# Patient Record
Sex: Female | Born: 1975 | Race: White | Hispanic: No | Marital: Married | Smoking: Former smoker
Health system: Southern US, Community
[De-identification: ages and names within clinical notes are randomized; demographics above are authoritative.]

## PROBLEM LIST (undated history)

## (undated) ENCOUNTER — Inpatient Hospital Stay (HOSPITAL_COMMUNITY): Payer: Self-pay

## (undated) DIAGNOSIS — D649 Anemia, unspecified: Secondary | ICD-10-CM

## (undated) DIAGNOSIS — E039 Hypothyroidism, unspecified: Secondary | ICD-10-CM

## (undated) DIAGNOSIS — O210 Mild hyperemesis gravidarum: Secondary | ICD-10-CM

## (undated) DIAGNOSIS — O09529 Supervision of elderly multigravida, unspecified trimester: Secondary | ICD-10-CM

## (undated) DIAGNOSIS — Z8619 Personal history of other infectious and parasitic diseases: Secondary | ICD-10-CM

## (undated) DIAGNOSIS — M1990 Unspecified osteoarthritis, unspecified site: Secondary | ICD-10-CM

## (undated) DIAGNOSIS — E0590 Thyrotoxicosis, unspecified without thyrotoxic crisis or storm: Secondary | ICD-10-CM

## (undated) DIAGNOSIS — A609 Anogenital herpesviral infection, unspecified: Secondary | ICD-10-CM

## (undated) DIAGNOSIS — R51 Headache: Secondary | ICD-10-CM

## (undated) HISTORY — DX: Personal history of other infectious and parasitic diseases: Z86.19

## (undated) HISTORY — DX: Anemia, unspecified: D64.9

## (undated) HISTORY — PX: BREAST ENHANCEMENT SURGERY: SHX7

## (undated) HISTORY — DX: Supervision of elderly multigravida, unspecified trimester: O09.529

## (undated) HISTORY — DX: Mild hyperemesis gravidarum: O21.0

## (undated) HISTORY — DX: Hypothyroidism, unspecified: E03.9

## (undated) HISTORY — DX: Anogenital herpesviral infection, unspecified: A60.9

## (undated) HISTORY — DX: Thyrotoxicosis, unspecified without thyrotoxic crisis or storm: E05.90

## (undated) HISTORY — DX: Unspecified osteoarthritis, unspecified site: M19.90

## (undated) HISTORY — DX: Headache: R51

---

## 2004-05-21 HISTORY — PX: AUGMENTATION MAMMAPLASTY: SUR837

## 2004-05-21 HISTORY — PX: BREAST SURGERY: SHX581

## 2007-04-03 ENCOUNTER — Encounter: Admission: RE | Admit: 2007-04-03 | Discharge: 2007-04-03 | Payer: Self-pay | Admitting: Obstetrics and Gynecology

## 2007-08-18 ENCOUNTER — Inpatient Hospital Stay (HOSPITAL_COMMUNITY): Admission: AD | Admit: 2007-08-18 | Discharge: 2007-08-20 | Payer: Self-pay | Admitting: *Deleted

## 2008-12-30 ENCOUNTER — Inpatient Hospital Stay (HOSPITAL_COMMUNITY): Admission: AD | Admit: 2008-12-30 | Discharge: 2008-12-31 | Payer: Self-pay | Admitting: Obstetrics and Gynecology

## 2010-08-26 LAB — CBC
HCT: 33.9 % — ABNORMAL LOW (ref 36.0–46.0)
Hemoglobin: 10.3 g/dL — ABNORMAL LOW (ref 12.0–15.0)
MCHC: 34.3 g/dL (ref 30.0–36.0)
MCHC: 34.9 g/dL (ref 30.0–36.0)
MCV: 93.3 fL (ref 78.0–100.0)
Platelets: 121 10*3/uL — ABNORMAL LOW (ref 150–400)
Platelets: 122 10*3/uL — ABNORMAL LOW (ref 150–400)
RDW: 13.2 % (ref 11.5–15.5)
RDW: 13.3 % (ref 11.5–15.5)
WBC: 10.2 10*3/uL (ref 4.0–10.5)

## 2010-08-26 LAB — RPR: RPR Ser Ql: NONREACTIVE

## 2011-02-12 LAB — CBC
HCT: 30.1 — ABNORMAL LOW
Hemoglobin: 11.7 — ABNORMAL LOW
MCHC: 35.5
MCV: 91.4
Platelets: 154
RBC: 3.29 — ABNORMAL LOW
RBC: 3.65 — ABNORMAL LOW
WBC: 12.2 — ABNORMAL HIGH
WBC: 12.3 — ABNORMAL HIGH

## 2011-02-12 LAB — RPR: RPR Ser Ql: NONREACTIVE

## 2011-05-22 NOTE — L&D Delivery Note (Signed)
Delivery Note At 10:52 PM a viable and healthy female was delivered via Vaginal, Spontaneous Delivery (Presentation: Left Occiput Posterior).  APGAR: 9, 9; weight .  Pending Placenta status: Intact, Spontaneous.  Not sent Cord: 3 vessels with the following complications: None.  Cord pH: none  Anesthesia: None  Episiotomy: None Lacerations: 2nd degree;Perineal Suture Repair: 3.0 chromic Est. Blood Loss (mL): 200  Mom to postpartum.  Baby to nursery-stable.  Angelica Martin A 04/30/2012, 11:24 PM

## 2011-10-02 LAB — OB RESULTS CONSOLE HIV ANTIBODY (ROUTINE TESTING): HIV: NONREACTIVE

## 2011-10-02 LAB — OB RESULTS CONSOLE RPR: RPR: NONREACTIVE

## 2011-10-02 LAB — OB RESULTS CONSOLE HEPATITIS B SURFACE ANTIGEN: Hepatitis B Surface Ag: NEGATIVE

## 2011-10-17 LAB — OB RESULTS CONSOLE GC/CHLAMYDIA: Chlamydia: NEGATIVE

## 2011-11-02 ENCOUNTER — Inpatient Hospital Stay (HOSPITAL_COMMUNITY)
Admission: AD | Admit: 2011-11-02 | Discharge: 2011-11-02 | Disposition: A | Discharge status: Home / Self Care | Payer: Managed Care, Other (non HMO) | Source: Ambulatory Visit | Attending: Obstetrics and Gynecology | Admitting: Obstetrics and Gynecology

## 2011-11-02 ENCOUNTER — Encounter (HOSPITAL_COMMUNITY): Payer: Self-pay | Admitting: *Deleted

## 2011-11-02 DIAGNOSIS — O211 Hyperemesis gravidarum with metabolic disturbance: Secondary | ICD-10-CM | POA: Insufficient documentation

## 2011-11-02 DIAGNOSIS — E860 Dehydration: Secondary | ICD-10-CM | POA: Insufficient documentation

## 2011-11-02 DIAGNOSIS — Z3490 Encounter for supervision of normal pregnancy, unspecified, unspecified trimester: Secondary | ICD-10-CM

## 2011-11-02 MED ORDER — DEXTROSE 5 % IN LACTATED RINGERS IV BOLUS
1000.0000 mL | INTRAVENOUS | Status: AC
  Administered 2011-11-02: 1000 mL via INTRAVENOUS

## 2011-11-02 MED ORDER — SODIUM CHLORIDE 0.9 % IV SOLN
8.0000 mg | Freq: Once | INTRAVENOUS | Status: AC
  Administered 2011-11-02: 8 mg via INTRAVENOUS
  Filled 2011-11-02: qty 4

## 2011-11-02 MED ORDER — M.V.I. ADULT IV INJ
Freq: Once | INTRAVENOUS | Status: AC
  Administered 2011-11-02: 20:00:00 via INTRAVENOUS
  Filled 2011-11-02: qty 1000

## 2011-11-02 MED ORDER — SODIUM CHLORIDE 0.9 % IJ SOLN
INTRAMUSCULAR | Status: AC
  Filled 2011-11-02: qty 3

## 2011-11-02 NOTE — MAU Note (Signed)
History   36 yo [id number] MWF now 13 1/[redacted] week gestation sent for IVF due to 4lb weight loss in a week and c/o vomiting and diarrhea x 5 days. No fever. PNC complicated by HEG for which pt is taking Zofran  Chief Complaint  Patient presents with  . Nausea  diarrhea x 5 days   OB History    Grav Para Term Preterm Abortions TAB SAB Ect Mult Living   4 2 2  0 1 0 1 0 0 2      History reviewed. No pertinent past medical history.  Past Surgical History  Procedure Date  . Breast enhancement surgery     History reviewed. No pertinent family history.  History  Substance Use Topics  . Smoking status: Former Games developer  . Smokeless tobacco: Not on file  . Alcohol Use: No    Allergies: No Known Allergies  Prescriptions prior to admission  Medication Sig Dispense Refill  . acetaminophen (TYLENOL) 500 MG tablet Take 500 mg by mouth daily as needed.      . ondansetron (ZOFRAN) 4 MG tablet Take 4 mg by mouth every 8 (eight) hours as needed. For nausea      . valACYclovir (VALTREX) 500 MG tablet Take 500 mg by mouth daily. Pt takes only when she has a "flare-up"         Physical Exam   Blood pressure 119/79, pulse 84, temperature 97 F (36.1 C), temperature source Oral, resp. rate 18.  No exam performed today, exam done in office prior to sending pt here. ED Course  Dehydration 2nd to GI flu IUP @ 13 1/7 wk P) IVF x 2 L, Zofran IV, MVI one amp  MDM   Samayah Novinger A, MD 6:54 PM 11/02/2011

## 2011-11-02 NOTE — MAU Note (Signed)
Dr Cherly Hensen sent pt over from office for IV fluids for hyperemesis. Pt states nausea throughout pregnancy.

## 2011-11-04 DIAGNOSIS — Z3490 Encounter for supervision of normal pregnancy, unspecified, unspecified trimester: Secondary | ICD-10-CM

## 2012-04-09 LAB — OB RESULTS CONSOLE GBS: GBS: NEGATIVE

## 2012-04-16 ENCOUNTER — Other Ambulatory Visit: Payer: Self-pay | Admitting: Obstetrics and Gynecology

## 2012-04-21 ENCOUNTER — Encounter (HOSPITAL_COMMUNITY): Payer: Self-pay | Admitting: *Deleted

## 2012-04-21 ENCOUNTER — Telehealth (HOSPITAL_COMMUNITY): Payer: Self-pay | Admitting: *Deleted

## 2012-04-21 NOTE — Telephone Encounter (Signed)
Preadmission screen  

## 2012-04-30 ENCOUNTER — Inpatient Hospital Stay (HOSPITAL_COMMUNITY)
Admission: AD | Admit: 2012-04-30 | Discharge: 2012-05-02 | DRG: 775 | Disposition: A | Discharge status: Home / Self Care | Payer: Managed Care, Other (non HMO) | Source: Ambulatory Visit | Attending: Obstetrics and Gynecology | Admitting: Obstetrics and Gynecology

## 2012-04-30 ENCOUNTER — Encounter (HOSPITAL_COMMUNITY): Payer: Self-pay | Admitting: *Deleted

## 2012-04-30 DIAGNOSIS — Z3490 Encounter for supervision of normal pregnancy, unspecified, unspecified trimester: Secondary | ICD-10-CM

## 2012-04-30 DIAGNOSIS — E039 Hypothyroidism, unspecified: Secondary | ICD-10-CM | POA: Diagnosis present

## 2012-04-30 DIAGNOSIS — E079 Disorder of thyroid, unspecified: Secondary | ICD-10-CM | POA: Diagnosis present

## 2012-04-30 DIAGNOSIS — O09529 Supervision of elderly multigravida, unspecified trimester: Secondary | ICD-10-CM | POA: Diagnosis present

## 2012-04-30 LAB — ABO/RH: ABO/RH(D): O POS

## 2012-04-30 LAB — CBC
Hemoglobin: 12.4 g/dL (ref 12.0–15.0)
MCH: 31.6 pg (ref 26.0–34.0)
MCHC: 36 g/dL (ref 30.0–36.0)
Platelets: 162 10*3/uL (ref 150–400)
RDW: 13 % (ref 11.5–15.5)

## 2012-04-30 MED ORDER — IBUPROFEN 600 MG PO TABS
600.0000 mg | ORAL_TABLET | Freq: Four times a day (QID) | ORAL | Status: DC | PRN
  Administered 2012-05-01: 600 mg via ORAL
  Filled 2012-04-30: qty 1

## 2012-04-30 MED ORDER — OXYTOCIN 40 UNITS IN LACTATED RINGERS INFUSION - SIMPLE MED
62.5000 mL/h | INTRAVENOUS | Status: DC
  Administered 2012-04-30: 999 mL/h via INTRAVENOUS
  Filled 2012-04-30: qty 1000

## 2012-04-30 MED ORDER — ONDANSETRON HCL 4 MG/2ML IJ SOLN: 4.0000 mg | Freq: Four times a day (QID) | INTRAMUSCULAR | Status: DC | PRN

## 2012-04-30 MED ORDER — OXYTOCIN 10 UNIT/ML IJ SOLN
10.0000 [IU] | Freq: Once | INTRAMUSCULAR | Status: AC
  Administered 2012-04-30: 10 [IU] via INTRAMUSCULAR

## 2012-04-30 MED ORDER — LACTATED RINGERS IV SOLN
INTRAVENOUS | Status: DC
  Administered 2012-04-30: 22:00:00 via INTRAVENOUS

## 2012-04-30 MED ORDER — CITRIC ACID-SODIUM CITRATE 334-500 MG/5ML PO SOLN: 30.0000 mL | ORAL | Status: DC | PRN

## 2012-04-30 MED ORDER — ACETAMINOPHEN 325 MG PO TABS: 650.0000 mg | ORAL_TABLET | ORAL | Status: DC | PRN

## 2012-04-30 MED ORDER — OXYTOCIN 10 UNIT/ML IJ SOLN
INTRAMUSCULAR | Status: AC
  Filled 2012-04-30: qty 1

## 2012-04-30 MED ORDER — FLEET ENEMA 7-19 GM/118ML RE ENEM: 1.0000 | ENEMA | RECTAL | Status: DC | PRN

## 2012-04-30 MED ORDER — OXYTOCIN BOLUS FROM INFUSION: 500.0000 mL | INTRAVENOUS | Status: DC

## 2012-04-30 MED ORDER — OXYCODONE-ACETAMINOPHEN 5-325 MG PO TABS
1.0000 | ORAL_TABLET | ORAL | Status: DC | PRN
  Administered 2012-04-30: 1 via ORAL
  Filled 2012-04-30: qty 1

## 2012-04-30 MED ORDER — LACTATED RINGERS IV SOLN: 500.0000 mL | INTRAVENOUS | Status: DC | PRN

## 2012-04-30 MED ORDER — LIDOCAINE HCL (PF) 1 % IJ SOLN
30.0000 mL | INTRAMUSCULAR | Status: DC | PRN
  Administered 2012-04-30: 30 mL via SUBCUTANEOUS
  Filled 2012-04-30: qty 30

## 2012-04-30 NOTE — H&P (Signed)
Angelica Martin is a 36 y.o. female presenting now @ 38 5/7 weeks in active labor. SROM @ 9:30 pm History OB History    Grav Para Term Preterm Abortions TAB SAB Ect Mult Living   4 2 2  0 1 0 1 0 0 2     Past Medical History  Diagnosis Date  . Hypothyroidism   . Mild hyperemesis gravidarum, antepartum   . HSV (herpes simplex virus) anogenital infection   . Hyperthyroidism   . H/O varicella   . Headache   . AMA (advanced maternal age) multigravida 35+    Past Surgical History  Procedure Date  . Breast enhancement surgery   . Breast surgery 2006    aug   Family History: family history includes Alcohol abuse in her paternal grandfather; COPD in her paternal grandfather; Diabetes in her mother; Heart disease in her maternal grandmother; Hypertension in her father, maternal grandmother, paternal grandfather, and paternal grandmother; Migraines in her mother and sister; Multiple sclerosis in her paternal aunt; Stroke in her father, paternal grandfather, and paternal grandmother; and Thyroid cancer in her father. Social History:  reports that she has quit smoking. She does not have any smokeless tobacco history on file. She reports that she does not drink alcohol or use illicit drugs.   Prenatal Transfer Tool  Maternal Diabetes: No Genetic Screening: Normal Maternal Ultrasounds/Referrals: Normal Fetal Ultrasounds or other Referrals:  None Maternal Substance Abuse:  No Significant Maternal Medications:  Meds include: Other: valtrex Significant Maternal Lab Results:  Lab values include: Group B Strep negative Other Comments:  None  ROS neg  Dilation: 10 Effacement (%): 100 Station: +1 Exam by::  (dr Angelica Martin) Blood pressure 124/69, pulse 75, temperature 98.3 F (36.8 C), temperature source Oral, resp. rate 18. Maternal Exam:  Uterine Assessment: Contraction strength is moderate.  Contraction frequency is regular.   Abdomen: Patient reports no abdominal tenderness. Fetal  presentation: vertex  Introitus: Normal vulva. Amniotic fluid character: clear.  Pelvis: adequate for delivery.   Cervix: Cervix evaluated by digital exam.     Physical Exam  Constitutional: She is oriented to person, place, and time. She appears well-developed and well-nourished.  HENT:  Head: Normocephalic.  Neck: Neck supple.  Cardiovascular: Normal rate and regular rhythm.   Respiratory: Breath sounds normal.  GI: Soft.  Musculoskeletal: She exhibits no edema.  Neurological: She is alert and oriented to person, place, and time.  Skin: Skin is warm and dry.  Psychiatric: She has a normal mood and affect.    Prenatal labs: ABO, Rh: O/Positive/-- (05/14 0000) Antibody: Negative (05/14 0000) Rubella: Immune (05/14 0000) RPR: Nonreactive (05/14 0000)  HBsAg: Negative (05/14 0000)  HIV: Non-reactive (05/14 0000)  GBS: Negative (11/20 0000)   Assessment/Plan: Active labor P) admit. routine labs. Start pushing   Angelica Martin A 04/30/2012, 11:19 PM

## 2012-04-30 NOTE — Progress Notes (Signed)
IV infiltraed, d/c'd intact, pulses noted, MD at bedside and aware - arm wrapped in warm blanket, elevated No BPs or bracelets to arm Shela Commons, RN

## 2012-05-01 LAB — CBC
HCT: 32.8 % — ABNORMAL LOW (ref 36.0–46.0)
Hemoglobin: 11.4 g/dL — ABNORMAL LOW (ref 12.0–15.0)
MCH: 31.1 pg (ref 26.0–34.0)
MCHC: 34.8 g/dL (ref 30.0–36.0)
RDW: 13 % (ref 11.5–15.5)

## 2012-05-01 LAB — RPR: RPR Ser Ql: NONREACTIVE

## 2012-05-01 MED ORDER — LANOLIN HYDROUS EX OINT: TOPICAL_OINTMENT | CUTANEOUS | Status: DC | PRN

## 2012-05-01 MED ORDER — ONDANSETRON HCL 4 MG/2ML IJ SOLN: 4.0000 mg | INTRAMUSCULAR | Status: DC | PRN

## 2012-05-01 MED ORDER — DIPHENHYDRAMINE HCL 25 MG PO CAPS: 25.0000 mg | ORAL_CAPSULE | Freq: Four times a day (QID) | ORAL | Status: DC | PRN

## 2012-05-01 MED ORDER — SIMETHICONE 80 MG PO CHEW: 80.0000 mg | CHEWABLE_TABLET | ORAL | Status: DC | PRN

## 2012-05-01 MED ORDER — FERROUS SULFATE 325 (65 FE) MG PO TABS
325.0000 mg | ORAL_TABLET | Freq: Two times a day (BID) | ORAL | Status: DC
  Administered 2012-05-01 – 2012-05-02 (×3): 325 mg via ORAL
  Filled 2012-05-01 (×3): qty 1

## 2012-05-01 MED ORDER — ONDANSETRON HCL 4 MG PO TABS: 4.0000 mg | ORAL_TABLET | ORAL | Status: DC | PRN

## 2012-05-01 MED ORDER — ZOLPIDEM TARTRATE 5 MG PO TABS: 5.0000 mg | ORAL_TABLET | Freq: Every evening | ORAL | Status: DC | PRN

## 2012-05-01 MED ORDER — WITCH HAZEL-GLYCERIN EX PADS: 1.0000 "application " | MEDICATED_PAD | CUTANEOUS | Status: DC | PRN

## 2012-05-01 MED ORDER — PRENATAL MULTIVITAMIN CH
1.0000 | ORAL_TABLET | Freq: Every day | ORAL | Status: DC
  Administered 2012-05-01 – 2012-05-02 (×2): 1 via ORAL
  Filled 2012-05-01 (×2): qty 1

## 2012-05-01 MED ORDER — SENNOSIDES-DOCUSATE SODIUM 8.6-50 MG PO TABS
2.0000 | ORAL_TABLET | Freq: Every day | ORAL | Status: DC
  Administered 2012-05-02: 2 via ORAL

## 2012-05-01 MED ORDER — IBUPROFEN 600 MG PO TABS
600.0000 mg | ORAL_TABLET | Freq: Four times a day (QID) | ORAL | Status: DC
  Administered 2012-05-01 – 2012-05-02 (×5): 600 mg via ORAL
  Filled 2012-05-01 (×5): qty 1

## 2012-05-01 MED ORDER — DIBUCAINE 1 % RE OINT: 1.0000 "application " | TOPICAL_OINTMENT | RECTAL | Status: DC | PRN

## 2012-05-01 MED ORDER — BENZOCAINE-MENTHOL 20-0.5 % EX AERO
1.0000 "application " | INHALATION_SPRAY | CUTANEOUS | Status: DC | PRN
  Administered 2012-05-01: 1 via TOPICAL
  Filled 2012-05-01: qty 56

## 2012-05-01 MED ORDER — OXYCODONE-ACETAMINOPHEN 5-325 MG PO TABS: 1.0000 | ORAL_TABLET | ORAL | Status: DC | PRN

## 2012-05-01 NOTE — Progress Notes (Signed)
Pt to restroom to void, passed a large clot, voided large amount then became lightheaded, and flush.  Steadied back to bed. Instructed call to void again

## 2012-05-01 NOTE — Progress Notes (Signed)
Post Partum Day 1 NSVD with spontaneous onset of labor, viable female infant over 2nd degree laceration. Subjective: no complaints, up ad lib without syncope, voiding, tolerating PO, + flatus, +BM  Pain well controlled with po meds, taking motrin and percocet  BF: on demand Mood stable, bonding well    Objective: Blood pressure 130/68, pulse 85, temperature 98.7 F (37.1 C), temperature source Oral, resp. rate 18, height 5\' 6"  (1.676 m), weight 150 lb (68.04 kg), unknown if currently breastfeeding.  Physical Exam:  General: alert, cooperative and no distress Breasts: N/T Lungs: CTAB Heart: RRR Lochia: appropriate. Mod, rubra. Uterine Fundus: firm Perineum: 2nd degree laceration - healing well. DVT Evaluation: No evidence of DVT seen on physical exam. Negative Homan's sign. No cords or calf tenderness. No significant calf/ankle edema.   Basename 04/30/12 2214  HGB 12.4  HCT 34.4*   Results for orders placed during the hospital encounter of 04/30/12 (from the past 24 hour(s))  CBC     Status: Abnormal   Collection Time   04/30/12 10:14 PM      Component Value Range   WBC 8.9  4.0 - 10.5 K/uL   RBC 3.92  3.87 - 5.11 MIL/uL   Hemoglobin 12.4  12.0 - 15.0 g/dL   HCT 16.1 (*) 09.6 - 04.5 %   MCV 87.8  78.0 - 100.0 fL   MCH 31.6  26.0 - 34.0 pg   MCHC 36.0  30.0 - 36.0 g/dL   RDW 40.9  81.1 - 91.4 %   Platelets 162  150 - 400 K/uL  RPR     Status: Normal   Collection Time   04/30/12 10:14 PM      Component Value Range   RPR NON REACTIVE  NON REACTIVE  ABO/RH     Status: Normal   Collection Time   04/30/12 10:14 PM      Component Value Range   ABO/RH(D) O POS       Assessment/Plan: Plan for discharge tomorrow      LOS: 1 day   Angelica Martin 05/01/2012, 5:54 AM

## 2012-05-02 ENCOUNTER — Inpatient Hospital Stay (HOSPITAL_COMMUNITY): Admission: RE | Admit: 2012-05-02 | Payer: Managed Care, Other (non HMO) | Source: Ambulatory Visit

## 2012-05-02 MED ORDER — IBUPROFEN 600 MG PO TABS: 600.0000 mg | ORAL_TABLET | Freq: Four times a day (QID) | ORAL | Status: AC

## 2012-05-02 NOTE — Progress Notes (Signed)
Patient ID: Angelica Martin, female   DOB: [birth year], 36 y.o.   MRN: [id number] PPD # 2  Subjective: Pt reports feeling well and eager for d/c home/ Pain controlled with ibuprofen Tolerating po/ Voiding without problems/ No n/v Bleeding is light/ Newborn info:  Information for the patient's newborn:  [name], Mcbain Girl Xochitl [[id number]]  female Feeding: breast    Objective:  VS: Blood pressure 93/57, pulse 60, temperature 97.6 F (36.4 C), temperature source Oral, resp. rate 18.    Basename 05/01/12 0533 04/30/12 2214  WBC 13.0* 8.9  HGB 11.4* 12.4  HCT 32.8* 34.4*  PLT 138* 162    Blood type: --/--/O POS (12/11 2214) Rubella: Immune (05/14 0000)    Physical Exam:  General: A & O x 3  alert, cooperative and no distress CV: Regular rate and rhythm Resp: clear Abdomen: soft, nontender, normal bowel sounds Uterine Fundus: firm, below umbilicus, nontender Perineum: not inspected and pt is dressed Lochia: minimal Ext: Homans sign is negative, no sign of DVT and no edema, redness or tenderness in the calves or thighs    A/P: PPD # 2/ [id number]/ S/P: SVD with 2nd deg lac Doing well and stable for discharge home RX: Ibuprofen 600mg  po Q 6 hrs prn pain #30 Refill x 1 WOB/GYN booklet given Routine pp visit in 6wks   Demetrius Revel, MSN, [facility] 05/02/2012, 10:20 AM

## 2012-05-02 NOTE — Discharge Summary (Signed)
Obstetric Discharge Summary Reason for Admission: onset of labor and FT gestation @ 34wks; G4 P2 0 1 2 Prenatal Procedures: ultrasound Intrapartum Procedures: spontaneous vaginal delivery Postpartum Procedures: none Complications-Operative and Postpartum: 2nd degree perineal laceration Hemoglobin  Date Value Range Status  05/01/2012 11.4* 12.0 - 15.0 g/dL Final     HCT  Date Value Range Status  05/01/2012 32.8* 36.0 - 46.0 % Final    Physical Exam:  General: alert, cooperative and no distress Lochia: appropriate Uterine Fundus: firm Incision: na DVT Evaluation: No evidence of DVT seen on physical exam. Negative Homan's sign.  Discharge Diagnoses: Term Pregnancy-delivered and G4 P3 0 1 3  Discharge Information: Date: 05/02/2012 Activity: pelvic rest Diet: routine Medications: PNV and Ibuprofen Condition: stable Instructions: refer to practice specific booklet Discharge to: home Follow-up Information    Follow up with Angelica Heeney A, MD. In 6 weeks.   Contact information:   [address] [place] [address] [phone]          Newborn Data: Live born female  Birth Weight: 6 lb 14 oz (3118 g) APGAR: 9, 9  Home with mother.  FISHER,JULIE K 05/02/2012, 10:33 AM

## 2014-03-22 ENCOUNTER — Encounter (HOSPITAL_COMMUNITY): Payer: Self-pay | Admitting: *Deleted

## 2016-07-30 ENCOUNTER — Emergency Department (HOSPITAL_COMMUNITY): Payer: 59

## 2016-07-30 ENCOUNTER — Encounter (HOSPITAL_COMMUNITY): Payer: Self-pay

## 2016-07-30 ENCOUNTER — Emergency Department (HOSPITAL_COMMUNITY)
Admission: EM | Admit: 2016-07-30 | Discharge: 2016-07-30 | Disposition: A | Discharge status: Home / Self Care | Payer: 59 | Attending: Emergency Medicine | Admitting: Emergency Medicine

## 2016-07-30 DIAGNOSIS — Z87891 Personal history of nicotine dependence: Secondary | ICD-10-CM | POA: Insufficient documentation

## 2016-07-30 DIAGNOSIS — M79602 Pain in left arm: Secondary | ICD-10-CM

## 2016-07-30 DIAGNOSIS — F410 Panic disorder [episodic paroxysmal anxiety] without agoraphobia: Secondary | ICD-10-CM

## 2016-07-30 DIAGNOSIS — E039 Hypothyroidism, unspecified: Secondary | ICD-10-CM | POA: Diagnosis not present

## 2016-07-30 DIAGNOSIS — Z79899 Other long term (current) drug therapy: Secondary | ICD-10-CM | POA: Insufficient documentation

## 2016-07-30 DIAGNOSIS — R079 Chest pain, unspecified: Secondary | ICD-10-CM | POA: Diagnosis present

## 2016-07-30 DIAGNOSIS — R0602 Shortness of breath: Secondary | ICD-10-CM | POA: Diagnosis not present

## 2016-07-30 LAB — CBC
HEMATOCRIT: 37.3 % (ref 36.0–46.0)
Hemoglobin: 12.5 g/dL (ref 12.0–15.0)
MCH: 30.2 pg (ref 26.0–34.0)
MCHC: 33.5 g/dL (ref 30.0–36.0)
MCV: 90.1 fL (ref 78.0–100.0)
PLATELETS: 229 10*3/uL (ref 150–400)
RBC: 4.14 MIL/uL (ref 3.87–5.11)
RDW: 13.1 % (ref 11.5–15.5)
WBC: 5.7 10*3/uL (ref 4.0–10.5)

## 2016-07-30 LAB — BASIC METABOLIC PANEL
Anion gap: 10 (ref 5–15)
BUN: 13 mg/dL (ref 6–20)
CHLORIDE: 104 mmol/L (ref 101–111)
CO2: 24 mmol/L (ref 22–32)
Calcium: 9 mg/dL (ref 8.9–10.3)
Creatinine, Ser: 0.63 mg/dL (ref 0.44–1.00)
Glucose, Bld: 101 mg/dL — ABNORMAL HIGH (ref 65–99)
POTASSIUM: 3.7 mmol/L (ref 3.5–5.1)
SODIUM: 138 mmol/L (ref 135–145)

## 2016-07-30 LAB — D-DIMER, QUANTITATIVE: D-Dimer, Quant: 0.27 ug/mL-FEU (ref 0.00–0.50)

## 2016-07-30 LAB — TROPONIN I: Troponin I: <0.03 ng/mL (ref <0.03)

## 2016-07-30 NOTE — ED Triage Notes (Signed)
Pt endorses waking up with left arm pain and began to have shortness of breath with some chest discomfort. Pt states "I don't know if it was anxiety but I don't normally have anxiety" VSS.

## 2016-07-30 NOTE — ED Notes (Signed)
Sent add on label to main lab. 

## 2016-07-30 NOTE — ED Provider Notes (Signed)
[facility] DEPT Provider Note   CSN: [id number] Arrival date & time: 07/30/16  3785  By signing my name below, I, Higinio Plan, attest that this documentation has been prepared under the direction and in the presence of Orpah Greek, MD . Electronically Signed: Higinio Plan, Scribe. 07/30/2016. 3:16 AM.  History   Chief Complaint Chief Complaint  Patient presents with  . Shortness of Breath  . Chest Pain  . Anxiety   The history is provided by the patient. No language interpreter was used.   HPI Comments: Angelica Martin is a 41 y.o. female with PMHx of hyperthyroidism, who presents to the Emergency Department complaining of gradual onset, improving, left arm pain that began ~2 days ago and worsened this morning. Pt reports she experienced left arm pain before falling asleep and awoke ~2 hours PTA with severe pain throughout her left arm. She notes her pain is exacerbated at night and is unchanged with certain positions or movements. She states assocaited shortness of breath that began shortly after the onset of her arm pain and the sensation of generalized weakness when she stood up to walk to the bathroom. Pt reports she has been "flying a ton lately" within the [place] but denies any recent surgeries or immobilization and use of hormone replacement therapy. She also denies any numbness in her extremities and cardiac history.   Past Medical History:  Diagnosis Date  . AMA (advanced maternal age) multigravida 72+   . H/O varicella   . Headache(784.0)   . HSV (herpes simplex virus) anogenital infection   . Hyperthyroidism   . Hypothyroidism   . Mild hyperemesis gravidarum, antepartum     Patient Active Problem List   Diagnosis Date Noted  . NSVD (normal spontaneous vaginal delivery) 05/01/2012  . Perineal laceration during delivery, delivered 05/01/2012  . Postpartum care following vaginal delivery- 2nd (12/11) 05/01/2012  . Pregnancy 11/04/2011    Past Surgical  History:  Procedure Laterality Date  . BREAST ENHANCEMENT SURGERY    . BREAST SURGERY  2006   aug    OB History    Gravida Para Term Preterm AB Living   4 3 3  0 1 3   SAB TAB Ectopic Multiple Live Births   1 0 0 0 3     Home Medications    Prior to Admission medications   Medication Sig Start Date End Date Taking? Authorizing Provider  acetaminophen (TYLENOL) 500 MG tablet Take 500 mg by mouth daily as needed. For pain    Historical Provider, MD  ibuprofen (ADVIL,MOTRIN) 600 MG tablet Take 1 tablet (600 mg total) by mouth every 6 (six) hours. 05/02/12   Gustavo Lah, NP  Prenatal Vit-Fe Fumarate-FA (PRENATAL MULTIVITAMIN) TABS Take 1 tablet by mouth daily.    Historical Provider, MD    Family History Family History  Problem Relation Age of Onset  . Diabetes Mother   . Migraines Mother   . Hypertension Father   . Thyroid cancer Father     removed  . Stroke Father   . Migraines Sister   . Multiple sclerosis Paternal Aunt   . Hypertension Maternal Grandmother   . Heart disease Maternal Grandmother   . Hypertension Paternal Grandmother   . Stroke Paternal Grandmother   . Hypertension Paternal Grandfather   . Stroke Paternal Grandfather   . COPD Paternal Grandfather   . Alcohol abuse Paternal Grandfather     Social History Social History  Substance Use Topics  . Smoking status:  Former Smoker  . Smokeless tobacco: Never Used  . Alcohol use Yes     Comment: a glass of wine every other night   Allergies   Banana and Kiwi extract  Review of Systems Review of Systems  Respiratory: Positive for shortness of breath.   Musculoskeletal: Positive for arthralgias.  Neurological: Positive for weakness. Negative for numbness.  All other systems reviewed and are negative.  Physical Exam Updated Vital Signs BP 120/97 (BP Location: Right Arm)   Pulse 76   Temp 98 F (36.7 C) (Oral)   Resp 18   Ht 5\' 9"  (1.753 m)   Wt 122 lb (55.3 kg)   LMP 07/13/2016 (Approximate)    SpO2 100%   Breastfeeding? No   BMI 18.02 kg/m   Physical Exam  Constitutional: She is oriented to person, place, and time. She appears well-developed and well-nourished. No distress.  HENT:  Head: Normocephalic and atraumatic.  Right Ear: Hearing normal.  Left Ear: Hearing normal.  Nose: Nose normal.  Mouth/Throat: Oropharynx is clear and moist and mucous membranes are normal.  Eyes: Conjunctivae and EOM are normal. Pupils are equal, round, and reactive to light.  Neck: Normal range of motion. Neck supple.  Cardiovascular: Regular rhythm, S1 normal and S2 normal.  Exam reveals no gallop and no friction rub.   No murmur heard. Pulmonary/Chest: Effort normal and breath sounds normal. No respiratory distress. She exhibits no tenderness.  Abdominal: Soft. Normal appearance and bowel sounds are normal. There is no hepatosplenomegaly. There is no tenderness. There is no rebound, no guarding, no tenderness at McBurney's point and negative Murphy's sign. No hernia.  Musculoskeletal: Normal range of motion.  Neurological: She is alert and oriented to person, place, and time. She has normal strength. No cranial nerve deficit or sensory deficit. Coordination normal. GCS eye subscore is 4. GCS verbal subscore is 5. GCS motor subscore is 6.  Skin: Skin is warm, dry and intact. No rash noted. No cyanosis.  Psychiatric: She has a normal mood and affect. Her speech is normal and behavior is normal. Thought content normal.  Nursing note and vitals reviewed.  ED Treatments / Results  DIAGNOSTIC STUDIES:  Oxygen Saturation is 100% on RA, normal by my interpretation.    COORDINATION OF CARE:  3:10 AM Discussed treatment plan with pt at bedside and pt agreed to plan.  Labs (all labs ordered are listed, but only abnormal results are displayed) Labs Reviewed  BASIC METABOLIC PANEL - Abnormal; Notable for the following:       Result Value   Glucose, Bld 101 (*)    All other components within normal  limits  CBC  TROPONIN I  D-DIMER, QUANTITATIVE (NOT AT [facility])    EKG  EKG Interpretation  Date/Time:  Monday July 30 2016 02:32:46 EDT Ventricular Rate:  63 PR Interval:  156 QRS Duration: 86 QT Interval:  436 QTC Calculation: 446 R Axis:   85 Text Interpretation:  Normal sinus rhythm Normal ECG Confirmed by POLLINA  MD, CHRISTOPHER [id number]) on 07/30/2016 2:41:59 AM       Radiology Dg Chest 2 View  Result Date: 07/30/2016 CLINICAL DATA:  Chest discomfort and shortness of breath since 1 a.m. Pain down the left arm. EXAM: CHEST  2 VIEW COMPARISON:  None. FINDINGS: The heart size and mediastinal contours are within normal limits. Both lungs are clear. The visualized skeletal structures are unremarkable. IMPRESSION: No active cardiopulmonary disease. Electronically Signed   By: Oren Beckmann.D.  On: 07/30/2016 02:51    Procedures Procedures (including critical care time)  Medications Ordered in ED Medications - No data to display  Initial Impression / Assessment and Plan / ED Course  I have reviewed the triage vital signs and the nursing notes.  Pertinent labs & imaging results that were available during my care of the patient were reviewed by me and considered in my medical decision making (see chart for details).     Patient presents to the emergency department for evaluation of left arm pain, chest pain, shortness of breath. Patient reports that she has been experiencing pains in the left arm, mostly when she is laying in bed at night for the last couple of days. She woke up tonight had increased pain in the arm. No numbness or weakness, no paresthesias. She then started to have some pain in the chest, became short of breath and felt like her heart was racing. He did feel anxious when this was happening, but has no history of panic attacks.  She has no cardiac risk factors. Cardiac evaluation is unremarkable. This includes EKG and troponin. Patient has been traveling  recently by air. She does not have any unilateral leg swelling, signs of DVT. She is not hypoxic or tachycardic here in the ER. D-dimer was normal. This is reassuring and felt to adequately rule out PE. Patient's arm pain is likely musculoskeletal in nature, recommend anti-inflammatory medication, follow-up with PCP.  I personally performed the services described in this documentation, which was scribed in my presence. The recorded information has been reviewed and is accurate.   Final Clinical Impressions(s) / ED Diagnoses   Final diagnoses:  Arm pain, diffuse, left  Panic attack    New Prescriptions New Prescriptions   No medications on file     Orpah Greek, MD 07/30/16 0401

## 2016-07-30 NOTE — ED Notes (Signed)
Patient transported to X-ray 

## 2016-08-12 DIAGNOSIS — J029 Acute pharyngitis, unspecified: Secondary | ICD-10-CM | POA: Diagnosis not present

## 2016-08-12 DIAGNOSIS — J020 Streptococcal pharyngitis: Secondary | ICD-10-CM | POA: Diagnosis not present

## 2017-03-21 DIAGNOSIS — Z0000 Encounter for general adult medical examination without abnormal findings: Secondary | ICD-10-CM | POA: Diagnosis not present

## 2017-03-25 DIAGNOSIS — Z0000 Encounter for general adult medical examination without abnormal findings: Secondary | ICD-10-CM | POA: Diagnosis not present

## 2017-05-22 DIAGNOSIS — L720 Epidermal cyst: Secondary | ICD-10-CM | POA: Diagnosis not present

## 2017-05-22 DIAGNOSIS — D225 Melanocytic nevi of trunk: Secondary | ICD-10-CM | POA: Diagnosis not present

## 2017-05-22 DIAGNOSIS — L814 Other melanin hyperpigmentation: Secondary | ICD-10-CM | POA: Diagnosis not present

## 2017-06-24 DIAGNOSIS — R87610 Atypical squamous cells of undetermined significance on cytologic smear of cervix (ASC-US): Secondary | ICD-10-CM | POA: Diagnosis not present

## 2017-06-24 DIAGNOSIS — Z01419 Encounter for gynecological examination (general) (routine) without abnormal findings: Secondary | ICD-10-CM | POA: Diagnosis not present

## 2018-01-03 DIAGNOSIS — R109 Unspecified abdominal pain: Secondary | ICD-10-CM | POA: Diagnosis not present

## 2018-01-03 DIAGNOSIS — M545 Low back pain: Secondary | ICD-10-CM | POA: Diagnosis not present

## 2018-06-25 DIAGNOSIS — D225 Melanocytic nevi of trunk: Secondary | ICD-10-CM | POA: Diagnosis not present

## 2018-06-25 DIAGNOSIS — L821 Other seborrheic keratosis: Secondary | ICD-10-CM | POA: Diagnosis not present

## 2018-06-25 DIAGNOSIS — D2271 Melanocytic nevi of right lower limb, including hip: Secondary | ICD-10-CM | POA: Diagnosis not present

## 2018-06-25 DIAGNOSIS — Z1231 Encounter for screening mammogram for malignant neoplasm of breast: Secondary | ICD-10-CM | POA: Diagnosis not present

## 2018-06-25 DIAGNOSIS — Z681 Body mass index (BMI) 19 or less, adult: Secondary | ICD-10-CM | POA: Diagnosis not present

## 2018-06-25 DIAGNOSIS — Z01419 Encounter for gynecological examination (general) (routine) without abnormal findings: Secondary | ICD-10-CM | POA: Diagnosis not present

## 2018-07-11 IMAGING — CR DG CHEST 2V
2 series · 2 of 2 positions shown · non-contrast
Comparison: None.

CLINICAL DATA: Chest discomfort and shortness of breath since 1
a.m.. Pain down the left arm.

EXAM:
CHEST  2 VIEW

[chest pa]
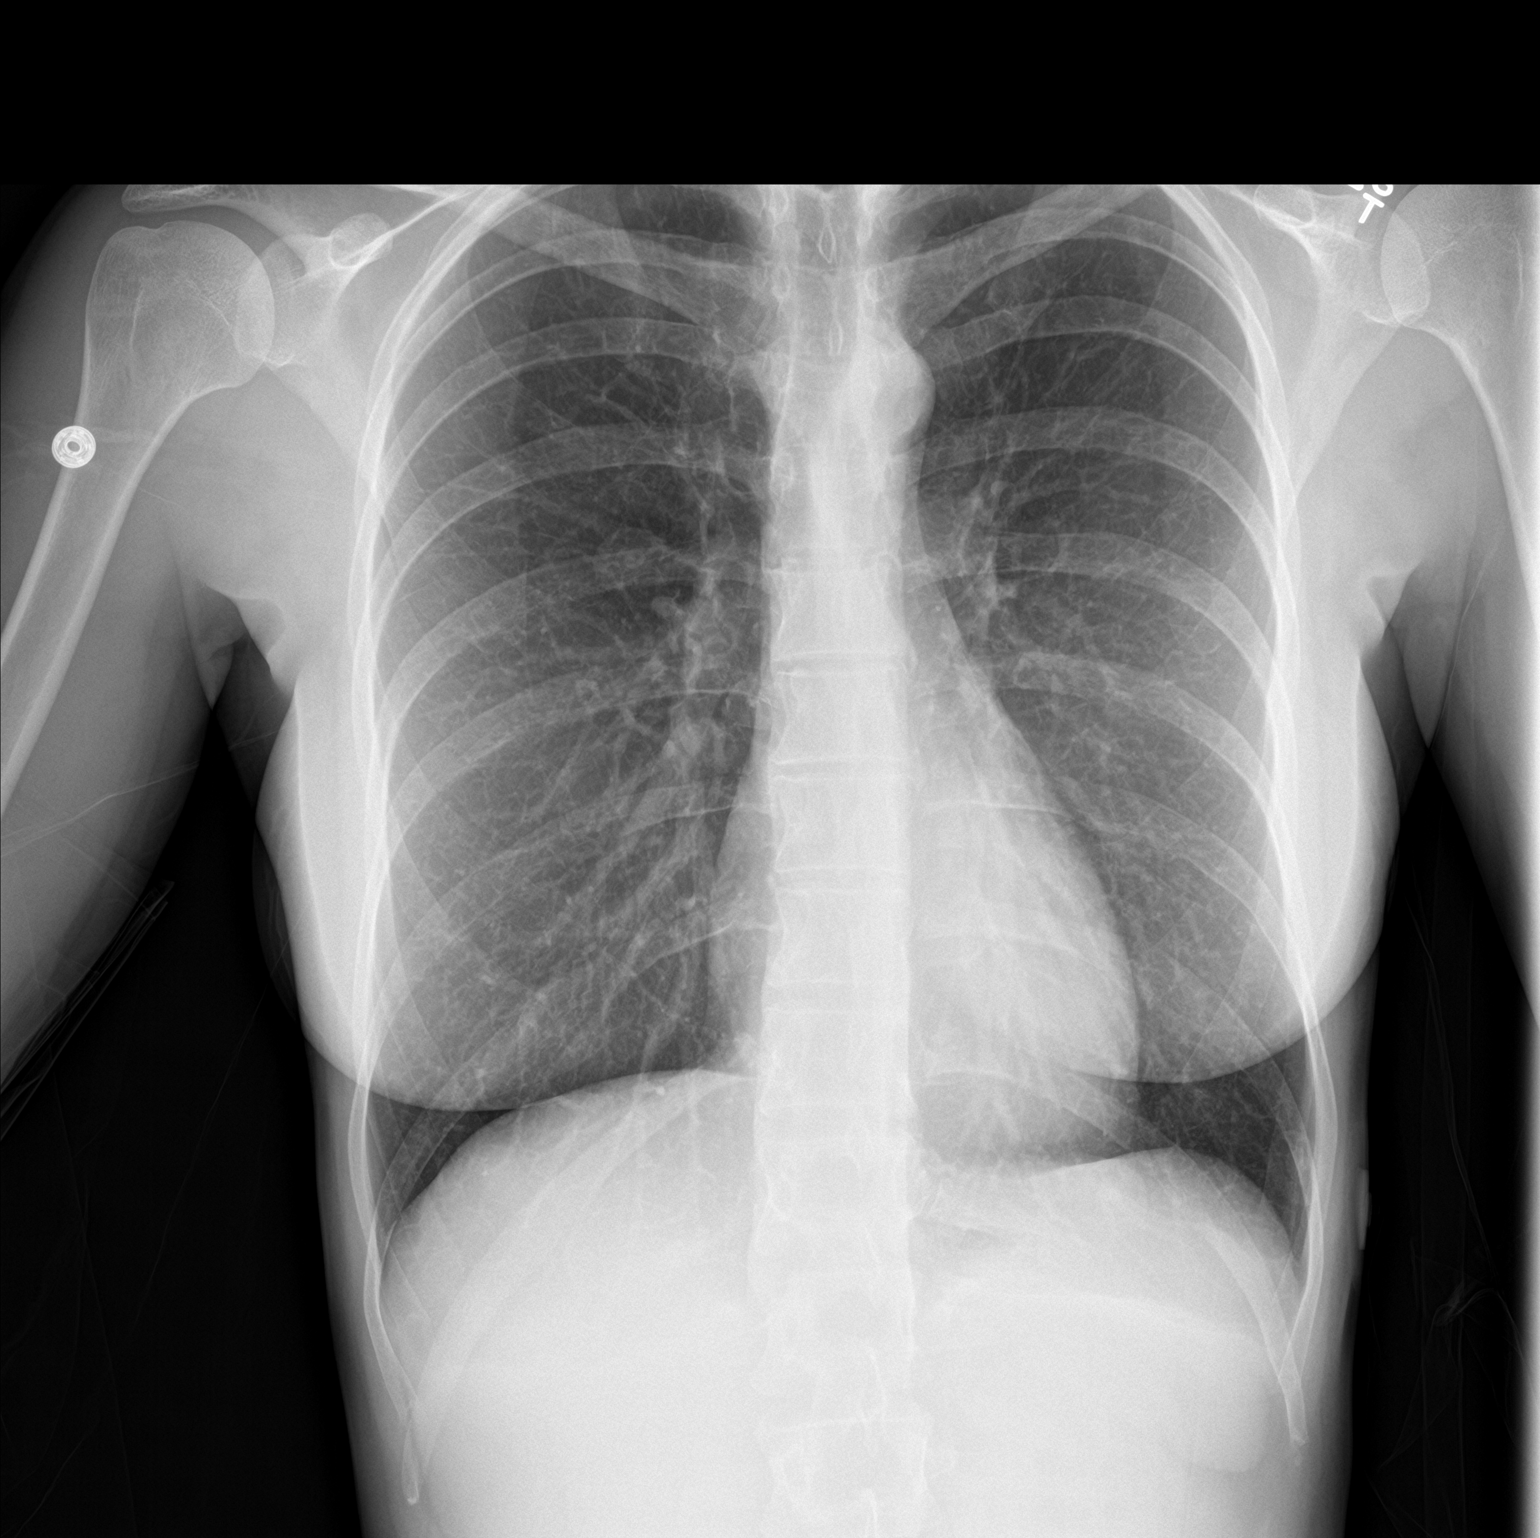

[chest lat]
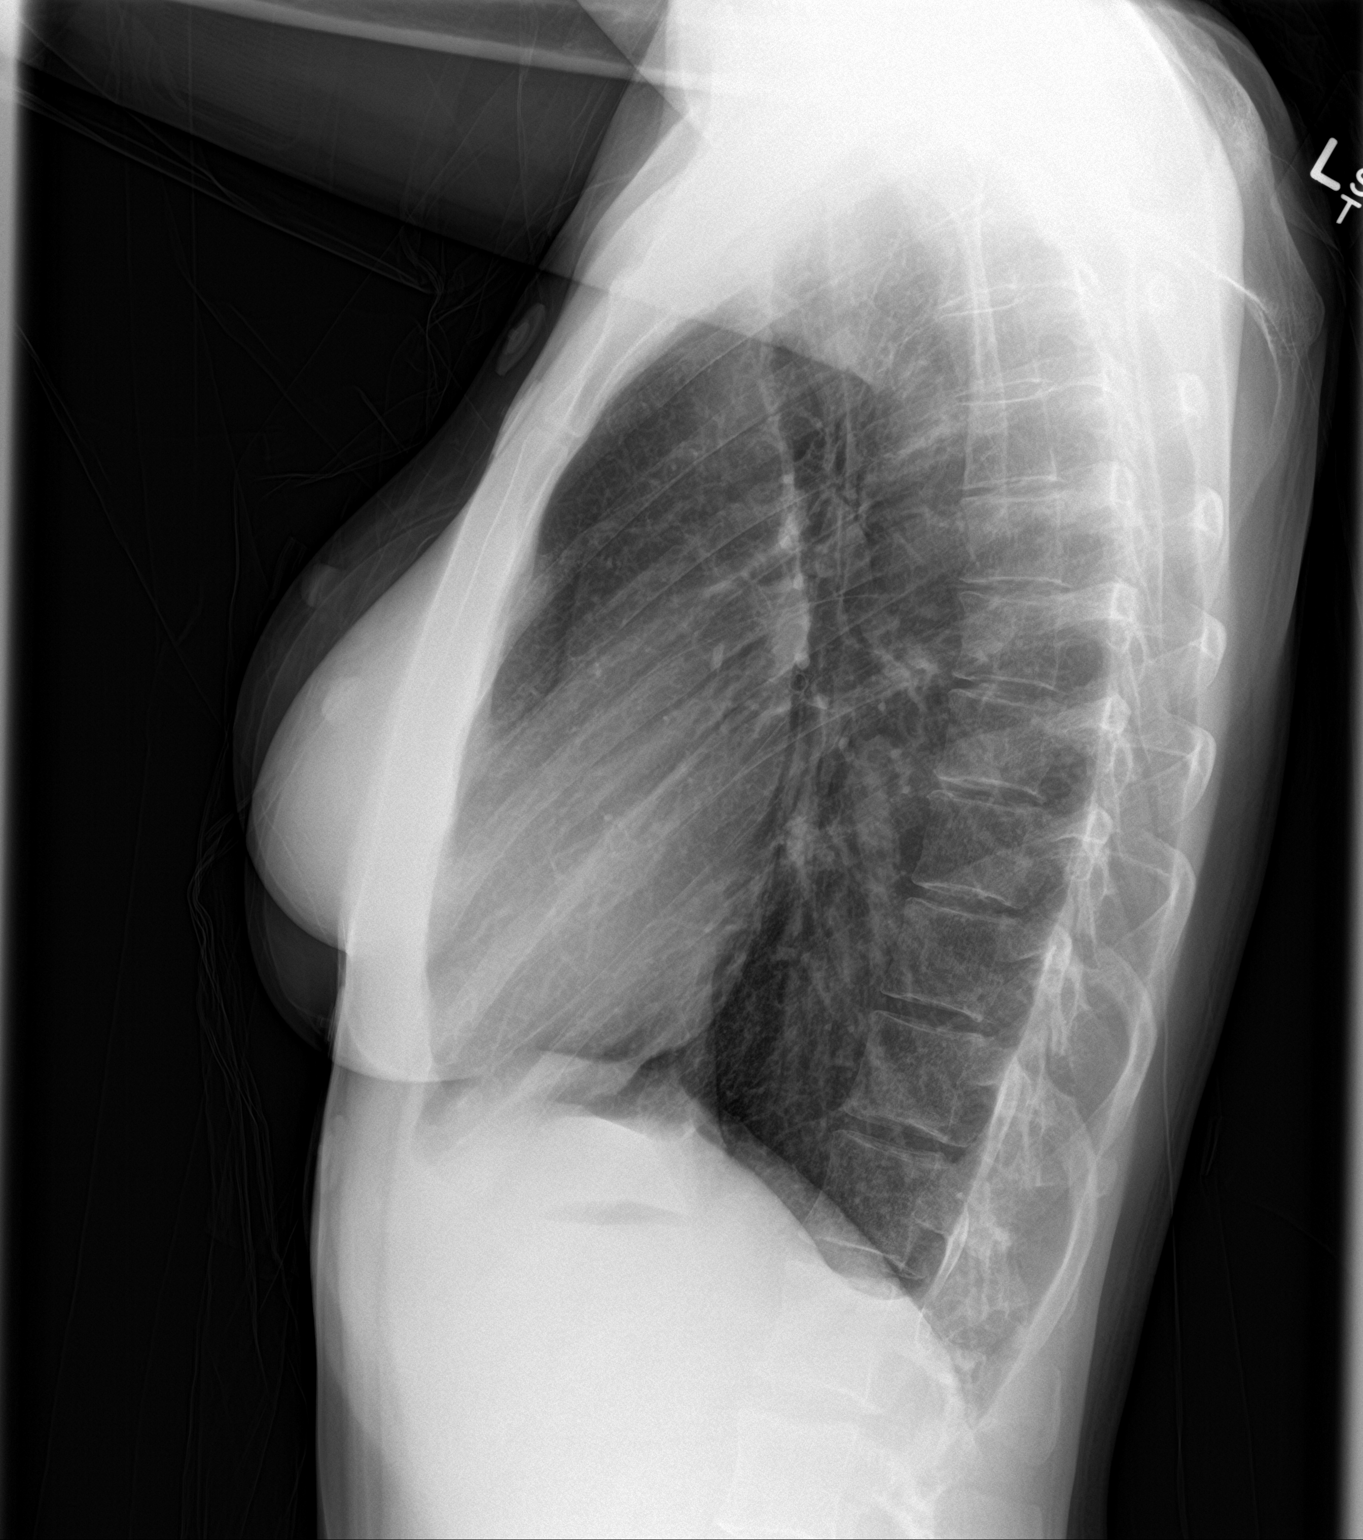

[2 of 2 positions shown; findings below may reference images not displayed]

FINDINGS: The heart size and mediastinal contours are within normal limits.
Both lungs are clear. The visualized skeletal structures are
unremarkable.
IMPRESSION: No active cardiopulmonary disease.

## 2018-08-05 DIAGNOSIS — R509 Fever, unspecified: Secondary | ICD-10-CM | POA: Diagnosis not present

## 2018-08-05 DIAGNOSIS — J101 Influenza due to other identified influenza virus with other respiratory manifestations: Secondary | ICD-10-CM | POA: Diagnosis not present

## 2018-12-05 ENCOUNTER — Other Ambulatory Visit: Payer: Self-pay

## 2018-12-05 DIAGNOSIS — Z20822 Contact with and (suspected) exposure to covid-19: Secondary | ICD-10-CM

## 2018-12-10 LAB — NOVEL CORONAVIRUS, NAA: SARS-CoV-2, NAA: NOT DETECTED

## 2019-04-20 ENCOUNTER — Other Ambulatory Visit: Payer: Self-pay

## 2019-04-20 DIAGNOSIS — Z20822 Contact with and (suspected) exposure to covid-19: Secondary | ICD-10-CM

## 2019-04-21 LAB — NOVEL CORONAVIRUS, NAA: SARS-CoV-2, NAA: NOT DETECTED

## 2019-06-15 ENCOUNTER — Other Ambulatory Visit: Payer: Managed Care, Other (non HMO)

## 2019-09-03 ENCOUNTER — Ambulatory Visit: Payer: Managed Care, Other (non HMO) | Attending: Internal Medicine

## 2019-09-03 DIAGNOSIS — Z23 Encounter for immunization: Secondary | ICD-10-CM

## 2019-09-03 NOTE — Progress Notes (Signed)
   Covid-19 Vaccination Clinic  Name:  Angelica Martin    MRN: [id number] DOB: 03-Jan-1976  09/03/2019  [name] was observed post Covid-19 immunization for 30 minutes based on pre-vaccination screening without incident. She was provided with Vaccine Information Sheet and instruction to access the V-Safe system.   [name] was instructed to call 911 with any severe reactions post vaccine: Marland Kitchen Difficulty breathing  . Swelling of face and throat  . A fast heartbeat  . A bad rash all over body  . Dizziness and weakness   Immunizations Administered    Name Date Dose VIS Date Route   Pfizer COVID-19 Vaccine 09/03/2019  8:36 AM 0.3 mL 05/01/2019 Intramuscular   Manufacturer: [place]   Lot: [id number]   [place]: [id number]

## 2019-09-29 ENCOUNTER — Ambulatory Visit: Payer: Managed Care, Other (non HMO) | Attending: Internal Medicine

## 2019-09-29 DIAGNOSIS — Z23 Encounter for immunization: Secondary | ICD-10-CM

## 2019-09-29 NOTE — Progress Notes (Signed)
   Covid-19 Vaccination Clinic  Name:  MOLINE EKLUND    MRN: [id number] DOB: 03-Jan-1976  09/29/2019  Ms. Drolet was observed post Covid-19 immunization for 30 minutes based on pre-vaccination screening without incident. She was provided with Vaccine Information Sheet and instruction to access the V-Safe system.   Ms. Brockschmidt was instructed to call 911 with any severe reactions post vaccine: Marland Kitchen Difficulty breathing  . Swelling of face and throat  . A fast heartbeat  . A bad rash all over body  . Dizziness and weakness   Immunizations Administered    Name Date Dose VIS Date Route   Pfizer COVID-19 Vaccine 09/29/2019  8:43 AM 0.3 mL 07/15/2018 Intramuscular   Manufacturer: [place]   Lot: [id number]   [place]: [id number]

## 2021-02-16 ENCOUNTER — Other Ambulatory Visit: Payer: Self-pay | Admitting: Obstetrics and Gynecology

## 2021-02-16 DIAGNOSIS — Z1231 Encounter for screening mammogram for malignant neoplasm of breast: Secondary | ICD-10-CM

## 2021-03-16 ENCOUNTER — Other Ambulatory Visit: Payer: Self-pay

## 2021-03-16 ENCOUNTER — Ambulatory Visit
Admission: RE | Admit: 2021-03-16 | Discharge: 2021-03-16 | Disposition: A | Discharge status: Home / Self Care | Payer: 59 | Source: Ambulatory Visit | Attending: Obstetrics and Gynecology | Admitting: Obstetrics and Gynecology

## 2021-03-16 DIAGNOSIS — Z1231 Encounter for screening mammogram for malignant neoplasm of breast: Secondary | ICD-10-CM

## 2021-03-24 ENCOUNTER — Other Ambulatory Visit: Payer: Self-pay | Admitting: Obstetrics and Gynecology

## 2021-03-24 DIAGNOSIS — R928 Other abnormal and inconclusive findings on diagnostic imaging of breast: Secondary | ICD-10-CM

## 2021-04-03 ENCOUNTER — Ambulatory Visit
Admission: RE | Admit: 2021-04-03 | Discharge: 2021-04-03 | Disposition: A | Discharge status: Home / Self Care | Payer: 59 | Source: Ambulatory Visit | Attending: Obstetrics and Gynecology | Admitting: Obstetrics and Gynecology

## 2021-04-03 ENCOUNTER — Other Ambulatory Visit: Payer: Self-pay

## 2021-04-03 DIAGNOSIS — R928 Other abnormal and inconclusive findings on diagnostic imaging of breast: Secondary | ICD-10-CM

## 2021-04-11 ENCOUNTER — Other Ambulatory Visit: Payer: 59

## 2021-07-03 ENCOUNTER — Telehealth: Payer: Self-pay | Admitting: Gastroenterology

## 2021-07-03 ENCOUNTER — Encounter: Payer: Self-pay | Admitting: Gastroenterology

## 2021-07-04 NOTE — Telephone Encounter (Signed)
ERROR

## 2021-07-11 ENCOUNTER — Encounter: Payer: Self-pay | Admitting: Gastroenterology

## 2021-08-07 ENCOUNTER — Encounter: Payer: Self-pay | Admitting: Gastroenterology

## 2021-08-07 ENCOUNTER — Ambulatory Visit (AMBULATORY_SURGERY_CENTER): Payer: 59 | Admitting: *Deleted

## 2021-08-07 ENCOUNTER — Other Ambulatory Visit: Payer: Self-pay

## 2021-08-07 VITALS — Ht 68.0 in | Wt 125.0 lb

## 2021-08-07 DIAGNOSIS — Z1211 Encounter for screening for malignant neoplasm of colon: Secondary | ICD-10-CM

## 2021-08-07 MED ORDER — NA SULFATE-K SULFATE-MG SULF 17.5-3.13-1.6 GM/177ML PO SOLN: 1.0000 | Freq: Once | ORAL | 0 refills | Status: AC

## 2021-08-07 NOTE — Progress Notes (Signed)
No egg or soy allergy known to patient  ?No issues known to pt with past sedation with any surgeries or procedures ?Patient denies ever being told they had issues or difficulty with intubation  ?No FH of Malignant Hyperthermia ?Pt is not on diet pills ?Pt is not on  home 02  ?Pt is not on blood thinners  ?Pt denies issues with constipation  ?No A fib or A flutter ? ?Pt is fully vaccinated  for Covid  ? ?NO PA's for preps discussed with pt In PV today  ?Discussed with pt there will be an out-of-pocket cost for prep and that varies from $0 to 70 +  dollars - pt verbalized understanding  ? ?Due to the COVID-19 pandemic we are asking patients to follow certain guidelines in PV and the [facility]   ?Pt aware of COVID protocols and [facility] guidelines  ? ?PV completed over the phone. Pt verified name, DOB, address and insurance during PV today. ?  ?Pt emailed instruction packet  to [email or web address]  ? ?Pt encouraged to call with questions or issues.  ?If pt has My chart, procedure instructions sent via My Chart  ? ?

## 2021-08-21 ENCOUNTER — Encounter: Payer: Self-pay | Admitting: Gastroenterology

## 2021-08-21 ENCOUNTER — Ambulatory Visit (AMBULATORY_SURGERY_CENTER): Payer: 59 | Admitting: Gastroenterology

## 2021-08-21 VITALS — BP 105/61 | HR 65 | Temp 97.5°F | Resp 16 | Ht 68.0 in | Wt 125.0 lb

## 2021-08-21 DIAGNOSIS — Z1211 Encounter for screening for malignant neoplasm of colon: Secondary | ICD-10-CM

## 2021-08-21 MED ORDER — SODIUM CHLORIDE 0.9 % IV SOLN: 500.0000 mL | Freq: Once | INTRAVENOUS | Status: DC

## 2021-08-21 NOTE — Progress Notes (Signed)
Report to PACU, RN, vss, BBS= Clear.  

## 2021-08-21 NOTE — Progress Notes (Signed)
Pt's states no medical or surgical changes since previsit or office visit. 

## 2021-08-21 NOTE — Progress Notes (Signed)
? ?Referring Provider: Ginger Organ., MD ?Primary Care Physician:  Ginger Organ., MD ? ?Indication for Procedure:  Colon cancer screening ? ? ?IMPRESSION:  ?Need for colon cancer screening ?Appropriate candidate for monitored anesthesia care ? ?PLAN: ?Colonoscopy in the [place] today ? ? ?HPI: Angelica Martin is a 46 y.o. female presents for screening colonoscopy. ? ?No prior colonoscopy or colon cancer screening. ? ?No baseline GI symptoms.  ? ?No known family history of colon cancer or polyps. No family history of uterine/endometrial cancer, pancreatic cancer or gastric/stomach cancer. ? ? ?Past Medical History:  ?Diagnosis Date  ? AMA (advanced maternal age) multigravida 22+   ? Anemia   ? in high school  ? 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 H/O varicella   ? Headache(784.0)   ? HSV (herpes simplex virus) anogenital infection   ? Hyperthyroidism   ? in high school  ? Hypothyroidism   ? in high school  ? Mild hyperemesis gravidarum, antepartum   ? ? ?Past Surgical History:  ?Procedure Laterality Date  ? AUGMENTATION MAMMAPLASTY Bilateral 2006  ? BREAST ENHANCEMENT SURGERY    ? BREAST SURGERY  05/21/2004  ? aug  ? ? ?Current Outpatient Medications  ?Medication Sig Dispense Refill  ? ibuprofen (ADVIL,MOTRIN) 600 MG tablet Take 1 tablet (600 mg total) by mouth every 6 (six) hours. 30 tablet 1  ? valACYclovir (VALTREX) 500 MG tablet Take 500 mg by mouth daily.    ? acetaminophen (TYLENOL) 500 MG tablet Take 500 mg by mouth daily as needed. For pain    ? ?Current Facility-Administered Medications  ?Medication Dose Route Frequency Provider Last Rate Last Admin  ? 0.9 %  sodium chloride infusion  500 mL Intravenous Once Thornton Park, MD      ? ? ?Allergies as of 08/21/2021 - Review Complete 08/21/2021  ?Allergen Reaction Noted  ? Banana Anaphylaxis 04/21/2012  ? Kiwi extract Anaphylaxis 04/21/2012  ? ? ?Family History  ?Problem Relation Age of Onset  ? Diabetes Mother   ? Migraines Mother   ?  Hypertension Father   ? Thyroid cancer Father   ?     removed  ? Stroke Father   ? Breast cancer Sister   ? Migraines Sister   ? Breast cancer Maternal Aunt   ? Multiple sclerosis Paternal Aunt   ? Hypertension Maternal Grandmother   ? Heart disease Maternal Grandmother   ? Hypertension Paternal Grandmother   ? Stroke Paternal Grandmother   ? Hypertension Paternal Grandfather   ? Stroke Paternal Grandfather   ? COPD Paternal Grandfather   ? Alcohol abuse Paternal Grandfather   ? Colon cancer Neg Hx   ? Colon polyps Neg Hx   ? Esophageal cancer Neg Hx   ? Rectal cancer Neg Hx   ? Stomach cancer Neg Hx   ? ? ? ?Physical Exam: ?General:   Alert,  well-nourished, pleasant and cooperative in NAD ?Head:  Normocephalic and atraumatic. ?Eyes:  Sclera clear, no icterus.   Conjunctiva pink. ?Mouth:  No deformity or lesions.   ?Neck:  Supple; no masses or thyromegaly. ?Lungs:  Clear throughout to auscultation.   No wheezes. ?Heart:  Regular rate and rhythm; no murmurs. ?Abdomen:  Soft, non-tender, nondistended, normal bowel sounds, no rebound or guarding.  ?Msk:  Symmetrical. No boney deformities ?LAD: No inguinal or umbilical LAD ?Extremities:  No clubbing or edema. ?Neurologic:  Alert and  oriented x4;  grossly nonfocal ?Skin:  No obvious rash or bruise. ?Psych:  Alert and cooperative. Normal mood and affect. ? ? ? ? ?Studies/Results: ?No results found. ? ? ? ?Rox Mcgriff L. Tarri Glenn, MD, MPH ?08/21/2021, 8:36 AM ? ? ? ?  ?

## 2021-08-21 NOTE — Patient Instructions (Signed)
Repeat colonoscopy in 10 years  ? ?YOU HAD AN ENDOSCOPIC PROCEDURE TODAY AT [facility]:   Refer to the procedure report that was given to you for any specific questions about what was found during the examination.  If the procedure report does not answer your questions, please call your gastroenterologist to clarify.  If you requested that your care partner not be given the details of your procedure findings, then the procedure report has been included in a sealed envelope for you to review at your convenience later. ? ?YOU SHOULD EXPECT: Some feelings of bloating in the abdomen. Passage of more gas than usual.  Walking can help get rid of the air that was put into your GI tract during the procedure and reduce the bloating. If you had a lower endoscopy (such as a colonoscopy or flexible sigmoidoscopy) you may notice spotting of blood in your stool or on the toilet paper. If you underwent a bowel prep for your procedure, you may not have a normal bowel movement for a few days. ? ?Please Note:  You might notice some irritation and congestion in your nose or some drainage.  This is from the oxygen used during your procedure.  There is no need for concern and it should clear up in a day or so. ? ?SYMPTOMS TO REPORT IMMEDIATELY: ? ?Following lower endoscopy (colonoscopy or flexible sigmoidoscopy): ? Excessive amounts of blood in the stool ? Significant tenderness or worsening of abdominal pains ? Swelling of the abdomen that is new, acute ? Fever of 100?F or higher ? ?For urgent or emergent issues, a gastroenterologist can be reached at any hour by calling [phone]. ?Do not use MyChart messaging for urgent concerns.  ? ? ?DIET:  We do recommend a small meal at first, but then you may proceed to your regular diet.  Drink plenty of fluids but you should avoid alcoholic beverages for 24 hours. ? ?ACTIVITY:  You should plan to take it easy for the rest of today and you should NOT DRIVE or use heavy  machinery until tomorrow (because of the sedation medicines used during the test).   ? ?FOLLOW UP: ?Our staff will call the number listed on your records 48-72 hours following your procedure to check on you and address any questions or concerns that you may have regarding the information given to you following your procedure. If we do not reach you, we will leave a message.  We will attempt to reach you two times.  During this call, we will ask if you have developed any symptoms of COVID 19. If you develop any symptoms (ie: fever, flu-like symptoms, shortness of breath, cough etc.) before then, please call [phone].  If you test positive for Covid 19 in the 2 weeks post procedure, please call and report this information to [place].   ? ?If any biopsies were taken you will be contacted by phone or by letter within the next 1-3 weeks.  Please call us at [phone] if you have not heard about the biopsies in 3 weeks.  ? ? ?SIGNATURES/CONFIDENTIALITY: ?You and/or your care partner have signed paperwork which will be entered into your electronic medical record.  These signatures attest to the fact that that the information above on your After Visit Summary has been reviewed and is understood.  Full responsibility of the confidentiality of this discharge information lies with you and/or your care-partner. ? ? ?

## 2021-08-21 NOTE — Op Note (Signed)
[place] ?Patient Name: Angelica Martin ?Procedure Date: 08/21/2021 8:33 AM ?MRN: [id number] ?Endoscopist: Thornton Park MD, MD ?Age: 46 ?Referring MD:  ?Date of Birth: [birth year] ?Gender: Female ?Account #: [id number] ?Procedure:                Colonoscopy ?Indications:              Screening for colorectal malignant neoplasm, This  ?                          is the patient's first colonoscopy ?                          No known family history of colon cancer or polyps ?Medicines:                Monitored Anesthesia Care ?Procedure:                Pre-Anesthesia Assessment: ?                          - Prior to the procedure, a History and Physical  ?                          was performed, and patient medications and  ?                          allergies were reviewed. The patient's tolerance of  ?                          previous anesthesia was also reviewed. The risks  ?                          and benefits of the procedure and the sedation  ?                          options and risks were discussed with the patient.  ?                          All questions were answered, and informed consent  ?                          was obtained. Prior Anticoagulants: The patient has  ?                          taken no previous anticoagulant or antiplatelet  ?                          agents. ASA Grade Assessment: I - A normal, healthy  ?                          patient. After reviewing the risks and benefits,  ?                          the patient was deemed in satisfactory condition to  ?  undergo the procedure. ?                          After obtaining informed consent, the colonoscope  ?                          was passed under direct vision. Throughout the  ?                          procedure, the patient's blood pressure, pulse, and  ?                          oxygen saturations were monitored continuously. The  ?                          CF [id number] #[id number] was introduced  through the anus  ?                          and advanced to the 3 cm into the ileum. A second  ?                          forward view of the right colon was performed. The  ?                          colonoscopy was performed without difficulty. The  ?                          patient tolerated the procedure well. The quality  ?                          of the bowel preparation was excellent. The  ?                          terminal ileum, ileocecal valve, appendiceal  ?                          orifice, and rectum were photographed. ?Scope In: 8:46:20 AM ?Scope Out: 8:58:35 AM ?Scope Withdrawal Time: 0 hours 9 minutes 10 seconds  ?Total Procedure Duration: 0 hours 12 minutes 15 seconds  ?Findings:                 The perianal and digital rectal examinations were  ?                          normal. ?                          The colon (entire examined portion) appeared normal. ?Complications:            No immediate complications. ?Estimated Blood Loss:     Estimated blood loss: none. ?Impression:               - The entire examined colon is normal. ?                          - No  specimens collected. ?Recommendation:           - Patient has a contact number available for  ?                          emergencies. The signs and symptoms of potential  ?                          delayed complications were discussed with the  ?                          patient. Return to normal activities tomorrow.  ?                          Written discharge instructions were provided to the  ?                          patient. ?                          - Resume previous diet. ?                          - Continue present medications. ?                          - Repeat colonoscopy in 10 years for surveillance,  ?                          earlier with new symptoms. ?                          - Emerging evidence supports eating a diet of  ?                          fruits, vegetables, grains, calcium, and yogurt  ?                           while reducing red meat and alcohol may reduce the  ?                          risk of colon cancer. ?                          - Thank you for allowing me to be involved in your  ?                          colon cancer prevention. ?Thornton Park MD, MD ?08/21/2021 9:01:41 AM ?This report has been signed electronically. ?

## 2021-08-23 ENCOUNTER — Telehealth: Payer: Self-pay

## 2021-08-23 NOTE — Telephone Encounter (Signed)
?  Follow up Call- ? ? ?  08/21/2021  ?  7:52 AM  ?Call back number  ?Post procedure Call Back phone  # [phone]  ?Permission to leave phone message Yes  ?  ? ?Patient questions: ? ?Do you have a fever, pain , or abdominal swelling? No. ?Pain Score  0 * ? ?Have you tolerated food without any problems? Yes.   ? ?Have you been able to return to your normal activities? Yes.   ? ?Do you have any questions about your discharge instructions: ?Diet   No. ?Medications  No. ?Follow up visit  No. ? ?Do you have questions or concerns about your Care? No. ? ?Actions: ?* If pain score is 4 or above: ?No action needed, pain <4. ? ? ?

## 2022-03-05 ENCOUNTER — Other Ambulatory Visit: Payer: Self-pay | Admitting: Obstetrics and Gynecology

## 2022-03-05 DIAGNOSIS — Z1231 Encounter for screening mammogram for malignant neoplasm of breast: Secondary | ICD-10-CM

## 2022-04-23 ENCOUNTER — Ambulatory Visit
Admission: RE | Admit: 2022-04-23 | Discharge: 2022-04-23 | Disposition: A | Discharge status: Home / Self Care | Payer: 59 | Source: Ambulatory Visit | Attending: Obstetrics and Gynecology | Admitting: Obstetrics and Gynecology

## 2022-04-23 DIAGNOSIS — Z1231 Encounter for screening mammogram for malignant neoplasm of breast: Secondary | ICD-10-CM

## 2023-02-04 ENCOUNTER — Other Ambulatory Visit: Payer: Self-pay | Admitting: Obstetrics and Gynecology

## 2023-02-04 DIAGNOSIS — Z1231 Encounter for screening mammogram for malignant neoplasm of breast: Secondary | ICD-10-CM

## 2023-03-15 IMAGING — US US BREAST*L* LIMITED INC AXILLA
1 series · 2 of 2 positions shown · non-contrast
Comparison: Previous exam(s).

CLINICAL DATA: Screening recall for a possible left breast mass.

EXAM:
DIGITAL DIAGNOSTIC UNILATERAL LEFT MAMMOGRAM WITH IMPLANTS, CAD AND
TOMOSYNTHESIS; ULTRASOUND LEFT BREAST LIMITED
TECHNIQUE: Left digital diagnostic mammography and breast tomosynthesis was
performed. The images were evaluated with computer-aided detection.
Standard and/or implant displaced views were performed.; Targeted
ultrasound examination of the left breast was performed.

[Series 1: us breast*left* limited inc axilla · 0.06mm/px · 2 of 2 slices shown]
[im 1/2]
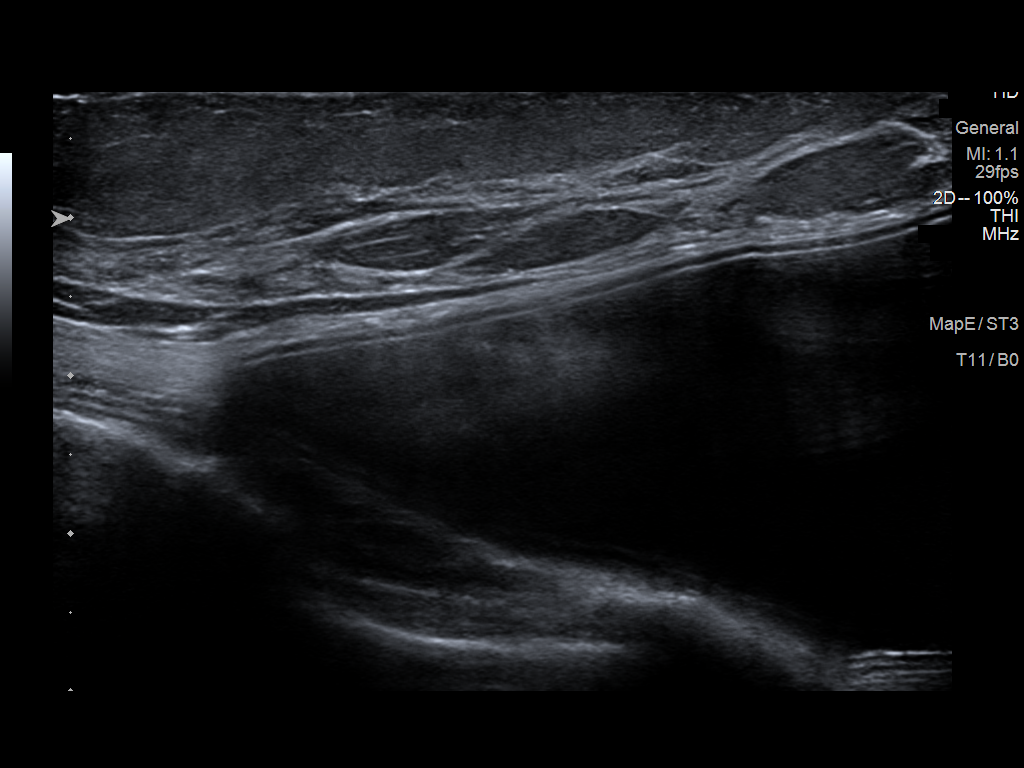
[im 2/2]
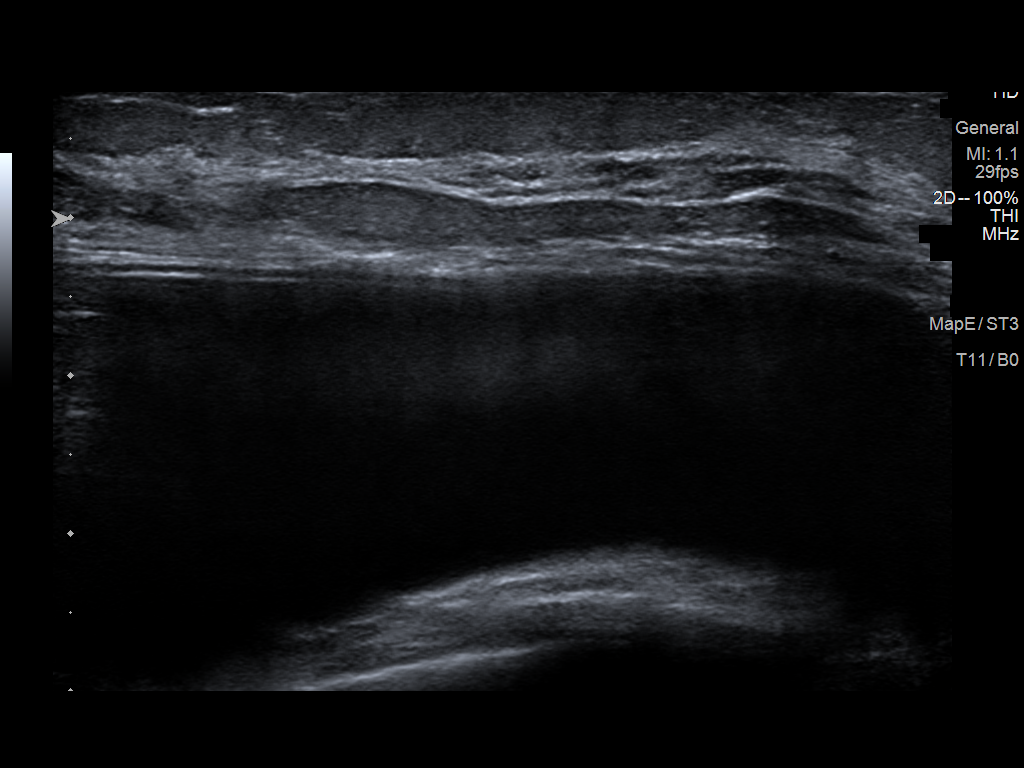

[2 of 2 positions shown; findings below may reference images not displayed]

ACR Breast Density Category c: The breast tissue is heterogeneously
dense, which may obscure small masses.
FINDINGS: On the diagnostic spot-compression images, the possible mass noted
in the left breast on the current screening exam disperses
consistent with superimposed fibroglandular tissue. There is no
underlying mass or significant residual asymmetry. There are no
areas of architectural distortion and there are no suspicious
calcifications. The patient has retropectoral implants.

Targeted left breast ultrasound is performed, showing normal
fibroglandular tissue throughout the retroareolar and upper aspect
of the left breast. No mass or suspicious lesion.
IMPRESSION: No evidence of breast malignancy.

RECOMMENDATION:
Screening mammogram in one year.(Code:OC-S-QC8)

Patient's sister was just diagnosed with breast carcinoma. Recommend
the patient undergo risk assessment and, if she is found to have a
greater than 20% lifetime risk for breast carcinoma, annual
supplemental high risk screening breast MRI should be considered.

I have discussed the findings and recommendations with the patient.
If applicable, a reminder letter will be sent to the patient
regarding the next appointment.

BI-RADS CATEGORY  1: Negative.

## 2023-03-15 IMAGING — MG MM DIGITAL DIAGNOSTIC UNILAT*L* IMPLANT W/ TOMO W/ CAD
6 series · 6 of 18 positions shown · non-contrast
Comparison: Previous exam(s).

CLINICAL DATA: Screening recall for a possible left breast mass.

EXAM:
DIGITAL DIAGNOSTIC UNILATERAL LEFT MAMMOGRAM WITH IMPLANTS, CAD AND
TOMOSYNTHESIS; ULTRASOUND LEFT BREAST LIMITED
TECHNIQUE: Left digital diagnostic mammography and breast tomosynthesis was
performed. The images were evaluated with computer-aided detection.
Standard and/or implant displaced views were performed.; Targeted
ultrasound examination of the left breast was performed.

[L MLO synth-2D (1 of 2)]
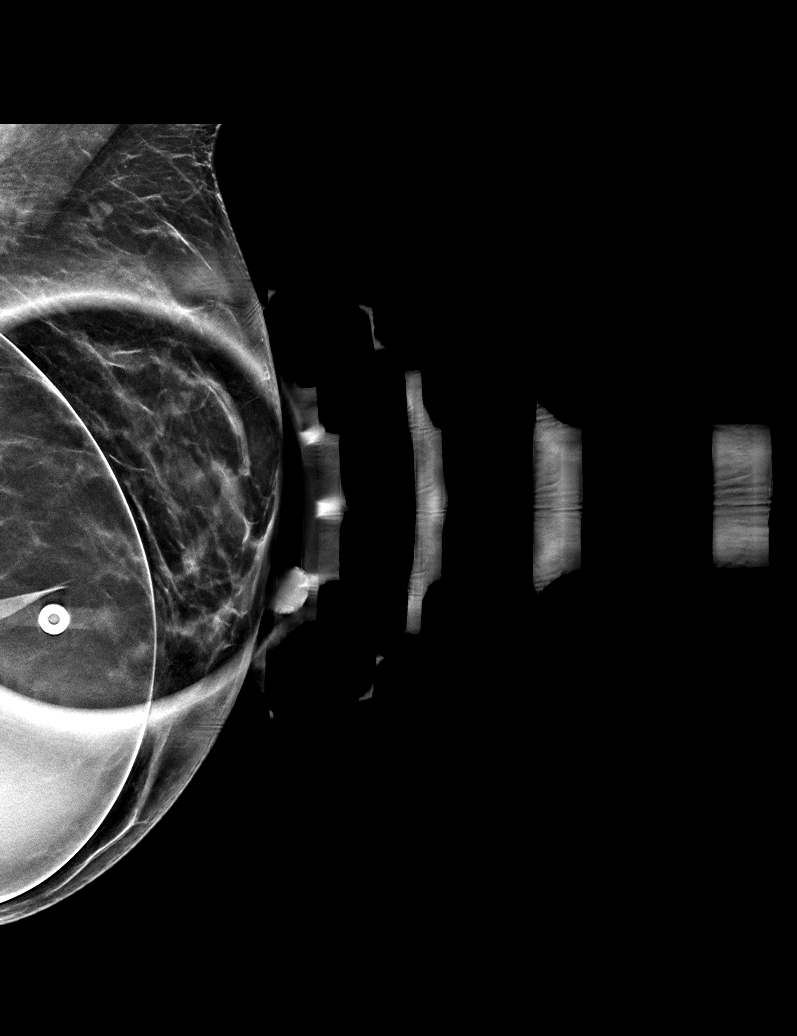

[L MLO synth-2D (2 of 2)]
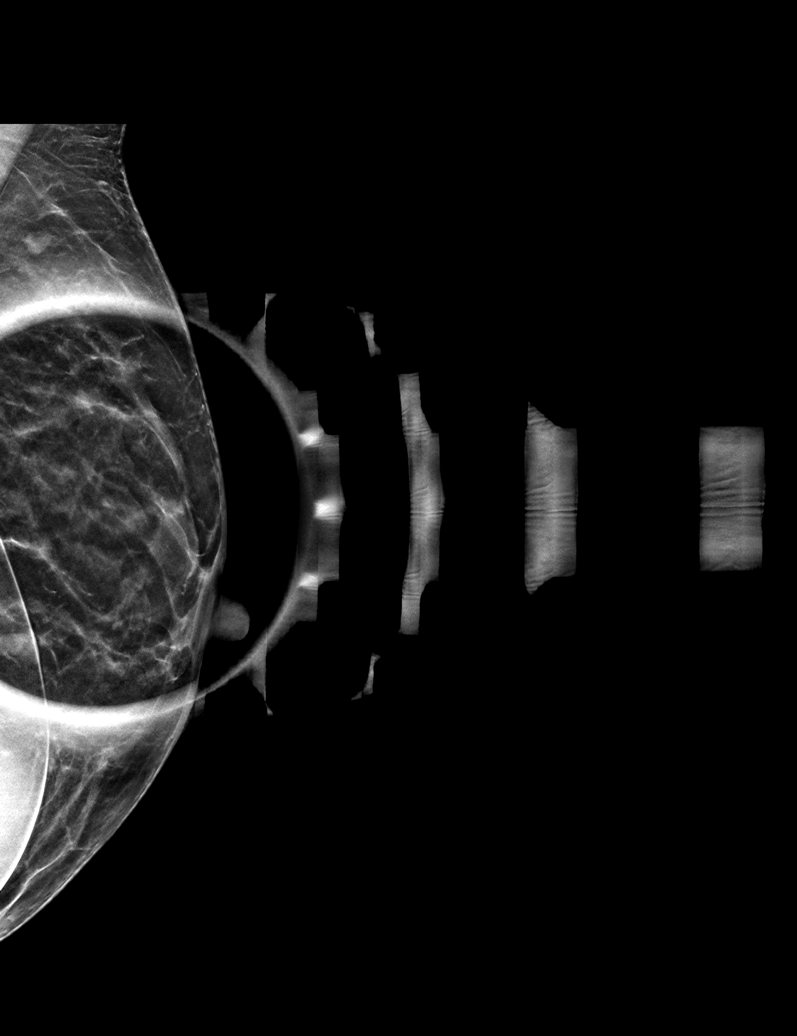

[L CC synth-2D]
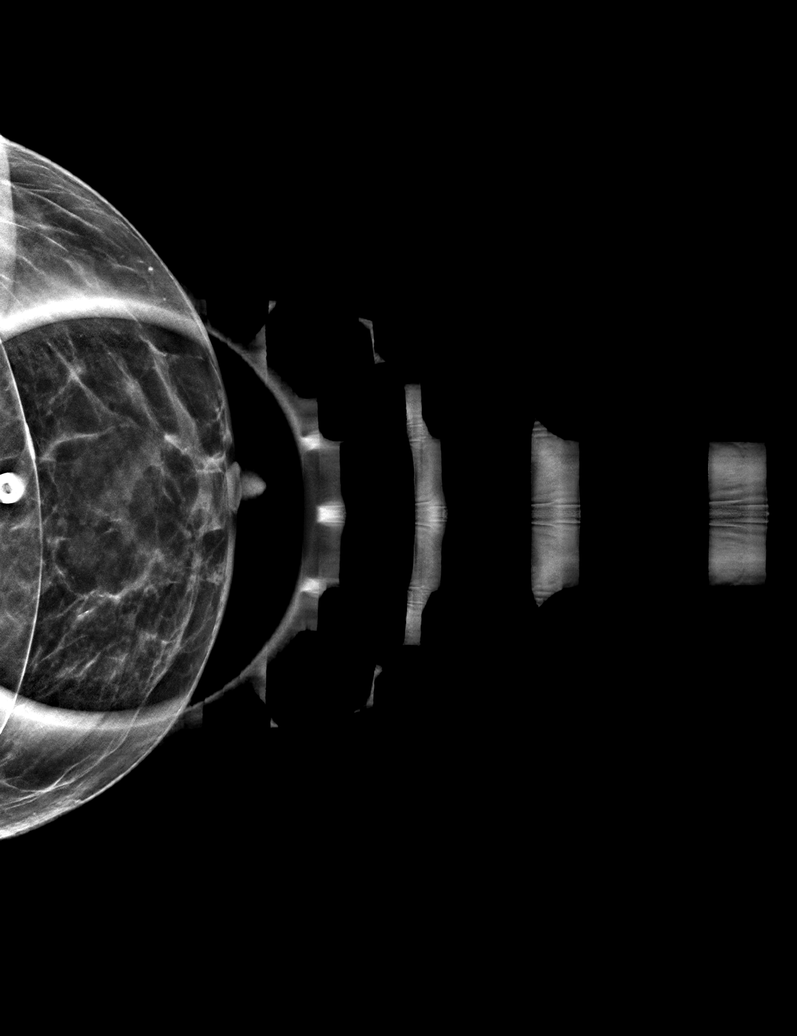

[L CCID BREAST TOMOSYNTHESIS IMAGE tomo · tomo slice 22/43.0]
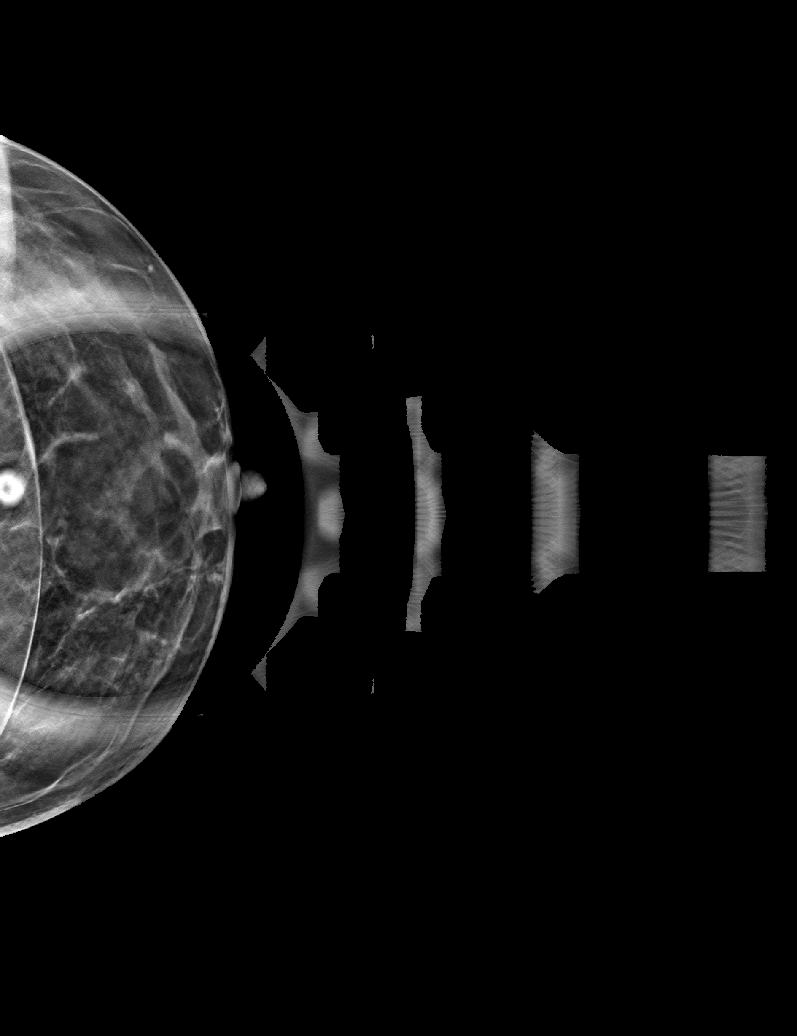

[L MLOID BREAST TOMOSYNTHESIS IMAGE tomo (1 of 2) · tomo slice 25/48.0]
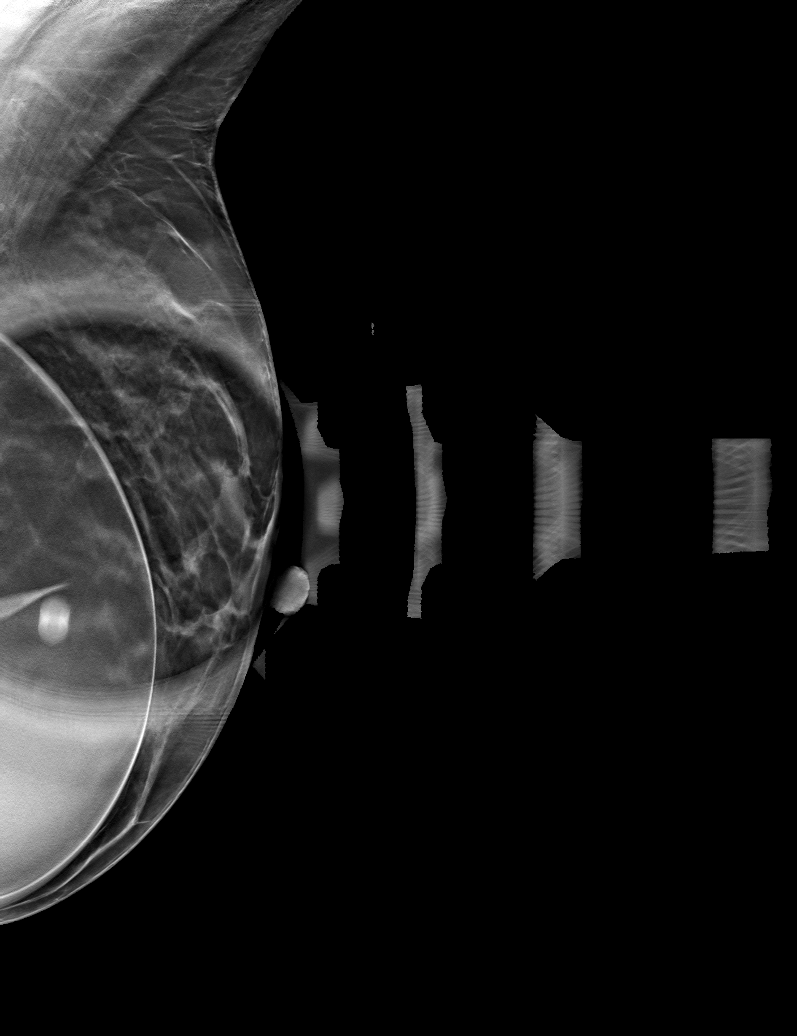

[L MLOID BREAST TOMOSYNTHESIS IMAGE tomo (2 of 2) · tomo slice 19/37.0]
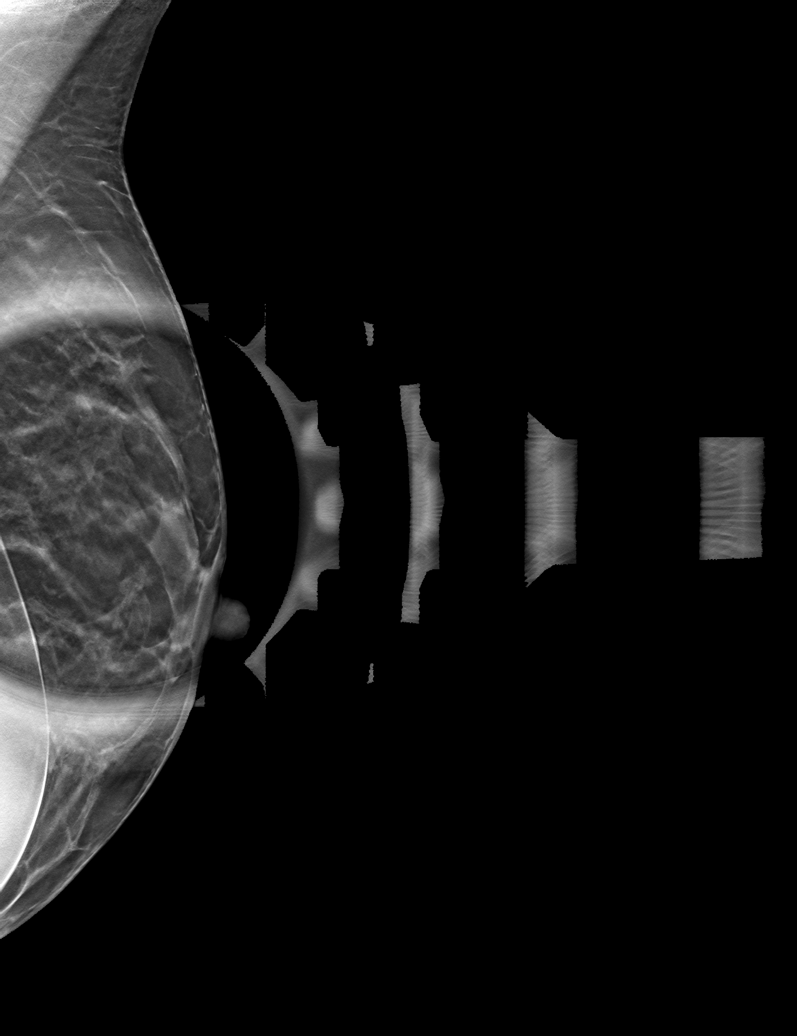

[6 of 18 positions shown; findings below may reference images not displayed]

ACR Breast Density Category c: The breast tissue is heterogeneously
dense, which may obscure small masses.
FINDINGS: On the diagnostic spot-compression images, the possible mass noted
in the left breast on the current screening exam disperses
consistent with superimposed fibroglandular tissue. There is no
underlying mass or significant residual asymmetry. There are no
areas of architectural distortion and there are no suspicious
calcifications. The patient has retropectoral implants.

Targeted left breast ultrasound is performed, showing normal
fibroglandular tissue throughout the retroareolar and upper aspect
of the left breast. No mass or suspicious lesion.
IMPRESSION: No evidence of breast malignancy.

RECOMMENDATION:
Screening mammogram in one year.(Code:OC-S-QC8)

Patient's sister was just diagnosed with breast carcinoma. Recommend
the patient undergo risk assessment and, if she is found to have a
greater than 20% lifetime risk for breast carcinoma, annual
supplemental high risk screening breast MRI should be considered.

I have discussed the findings and recommendations with the patient.
If applicable, a reminder letter will be sent to the patient
regarding the next appointment.

BI-RADS CATEGORY  1: Negative.

## 2023-04-26 ENCOUNTER — Ambulatory Visit: Payer: 59

## 2023-05-30 ENCOUNTER — Ambulatory Visit: Payer: 59

## 2023-07-16 ENCOUNTER — Ambulatory Visit
Admission: RE | Admit: 2023-07-16 | Discharge: 2023-07-16 | Disposition: A | Discharge status: Home / Self Care | Payer: 59 | Source: Ambulatory Visit | Attending: Obstetrics and Gynecology | Admitting: Obstetrics and Gynecology

## 2023-07-16 DIAGNOSIS — Z1231 Encounter for screening mammogram for malignant neoplasm of breast: Secondary | ICD-10-CM

## 2024-04-20 ENCOUNTER — Other Ambulatory Visit: Payer: Self-pay | Admitting: Obstetrics and Gynecology

## 2024-04-20 DIAGNOSIS — Z1231 Encounter for screening mammogram for malignant neoplasm of breast: Secondary | ICD-10-CM

## 2024-07-16 ENCOUNTER — Ambulatory Visit
